# Patient Record
Sex: Female | Born: 1965
Health system: Southern US, Community
[De-identification: ages and names within clinical notes are randomized; demographics above are authoritative.]

---

## 2006-10-01 ENCOUNTER — Other Ambulatory Visit: Admission: RE | Admit: 2006-10-01 | Discharge: 2006-10-01 | Payer: Self-pay | Admitting: Gynecology

## 2006-10-30 ENCOUNTER — Ambulatory Visit (HOSPITAL_COMMUNITY): Admission: RE | Admit: 2006-10-30 | Discharge: 2006-10-30 | Payer: Self-pay | Admitting: Gynecology

## 2007-04-16 ENCOUNTER — Ambulatory Visit (HOSPITAL_COMMUNITY): Admission: RE | Admit: 2007-04-16 | Discharge: 2007-04-16 | Payer: Self-pay | Admitting: Gynecology

## 2007-10-14 ENCOUNTER — Other Ambulatory Visit: Admission: RE | Admit: 2007-10-14 | Discharge: 2007-10-14 | Payer: Self-pay | Admitting: Gynecology

## 2007-10-28 ENCOUNTER — Encounter: Admission: RE | Admit: 2007-10-28 | Discharge: 2007-10-28 | Payer: Self-pay | Admitting: Gynecology

## 2010-08-14 ENCOUNTER — Encounter: Payer: Self-pay | Admitting: Gynecology

## 2011-12-11 ENCOUNTER — Other Ambulatory Visit: Payer: Self-pay | Admitting: Gastroenterology

## 2011-12-11 DIAGNOSIS — B182 Chronic viral hepatitis C: Secondary | ICD-10-CM

## 2012-01-08 ENCOUNTER — Other Ambulatory Visit: Payer: Self-pay

## 2012-01-15 ENCOUNTER — Ambulatory Visit
Admission: RE | Admit: 2012-01-15 | Discharge: 2012-01-15 | Disposition: A | Discharge status: Home / Self Care | Payer: BC Managed Care – PPO | Source: Ambulatory Visit | Attending: Gastroenterology | Admitting: Gastroenterology

## 2012-01-15 DIAGNOSIS — B182 Chronic viral hepatitis C: Secondary | ICD-10-CM

## 2013-02-12 ENCOUNTER — Other Ambulatory Visit: Payer: Self-pay | Admitting: Gastroenterology

## 2013-02-17 ENCOUNTER — Ambulatory Visit
Admission: RE | Admit: 2013-02-17 | Discharge: 2013-02-17 | Disposition: A | Discharge status: Home / Self Care | Payer: BC Managed Care – PPO | Source: Ambulatory Visit | Attending: Gastroenterology | Admitting: Gastroenterology

## 2013-07-24 HISTORY — PX: BREAST BIOPSY: SHX20

## 2013-12-31 ENCOUNTER — Other Ambulatory Visit: Payer: Self-pay | Admitting: Gynecology

## 2013-12-31 DIAGNOSIS — R928 Other abnormal and inconclusive findings on diagnostic imaging of breast: Secondary | ICD-10-CM

## 2014-01-12 ENCOUNTER — Other Ambulatory Visit: Payer: Self-pay | Admitting: Gynecology

## 2014-01-12 ENCOUNTER — Ambulatory Visit
Admission: RE | Admit: 2014-01-12 | Discharge: 2014-01-12 | Disposition: A | Discharge status: Home / Self Care | Payer: 59 | Source: Ambulatory Visit | Attending: Gynecology | Admitting: Gynecology

## 2014-01-12 DIAGNOSIS — R928 Other abnormal and inconclusive findings on diagnostic imaging of breast: Secondary | ICD-10-CM

## 2014-01-12 DIAGNOSIS — R921 Mammographic calcification found on diagnostic imaging of breast: Secondary | ICD-10-CM

## 2014-02-16 ENCOUNTER — Ambulatory Visit
Admission: RE | Admit: 2014-02-16 | Discharge: 2014-02-16 | Disposition: A | Discharge status: Home / Self Care | Payer: 59 | Source: Ambulatory Visit | Attending: Gynecology | Admitting: Gynecology

## 2014-02-16 ENCOUNTER — Other Ambulatory Visit: Payer: Self-pay | Admitting: Gynecology

## 2014-02-16 DIAGNOSIS — R921 Mammographic calcification found on diagnostic imaging of breast: Secondary | ICD-10-CM

## 2014-02-16 DIAGNOSIS — R928 Other abnormal and inconclusive findings on diagnostic imaging of breast: Secondary | ICD-10-CM

## 2014-03-18 ENCOUNTER — Other Ambulatory Visit: Payer: Self-pay | Admitting: Gastroenterology

## 2014-03-18 DIAGNOSIS — B182 Chronic viral hepatitis C: Secondary | ICD-10-CM

## 2014-04-06 ENCOUNTER — Ambulatory Visit
Admission: RE | Admit: 2014-04-06 | Discharge: 2014-04-06 | Disposition: A | Discharge status: Home / Self Care | Payer: 59 | Source: Ambulatory Visit | Attending: Gastroenterology | Admitting: Gastroenterology

## 2014-04-06 DIAGNOSIS — B182 Chronic viral hepatitis C: Secondary | ICD-10-CM

## 2014-10-27 IMAGING — MG MM LT BREAST BX W LOC DEV 1ST LESION
3 series · 3 of 3 positions shown · non-contrast
Comparison: Previous exams.

ADDENDUM:
Pathology revealed fibrocystic changes with calcifications in the
left breast. This was found to be concordant by Dr. Spandana Bodden.
Pathology was discussed with the patient by telephone. The patient
reported doing well after the biopsy. Post biopsy instructions were
reviewed and her questions were answered. She was encouraged to call
She was asked to return in 6 months for left diagnostic mammography.

Pathology results reported by Ismaelibrahim Mardan RN, BSN on February 17, 2014.
CLINICAL DATA: Suspicious left breast upper outer quadrant 3 mm
cluster of coarse heterogeneous calcifications
EXAM:
Left BREAST STEREOTACTIC CORE NEEDLE BIOPSY

[L CC]
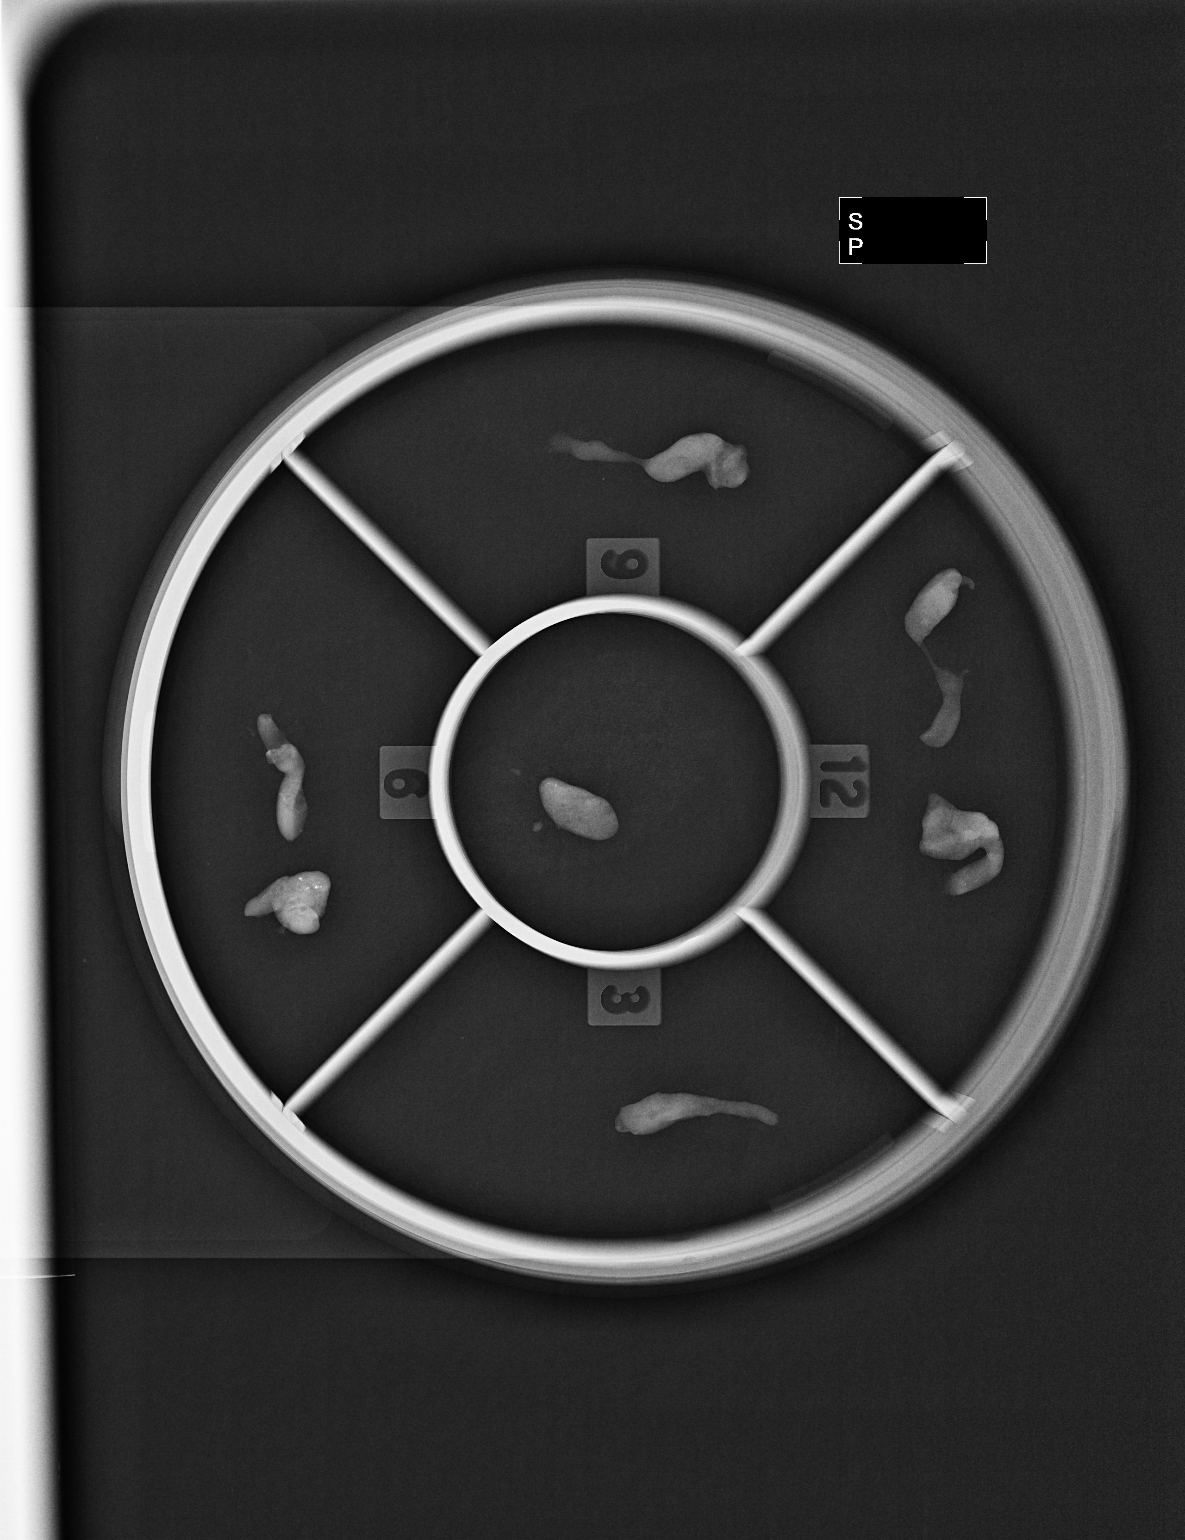

[L ML]
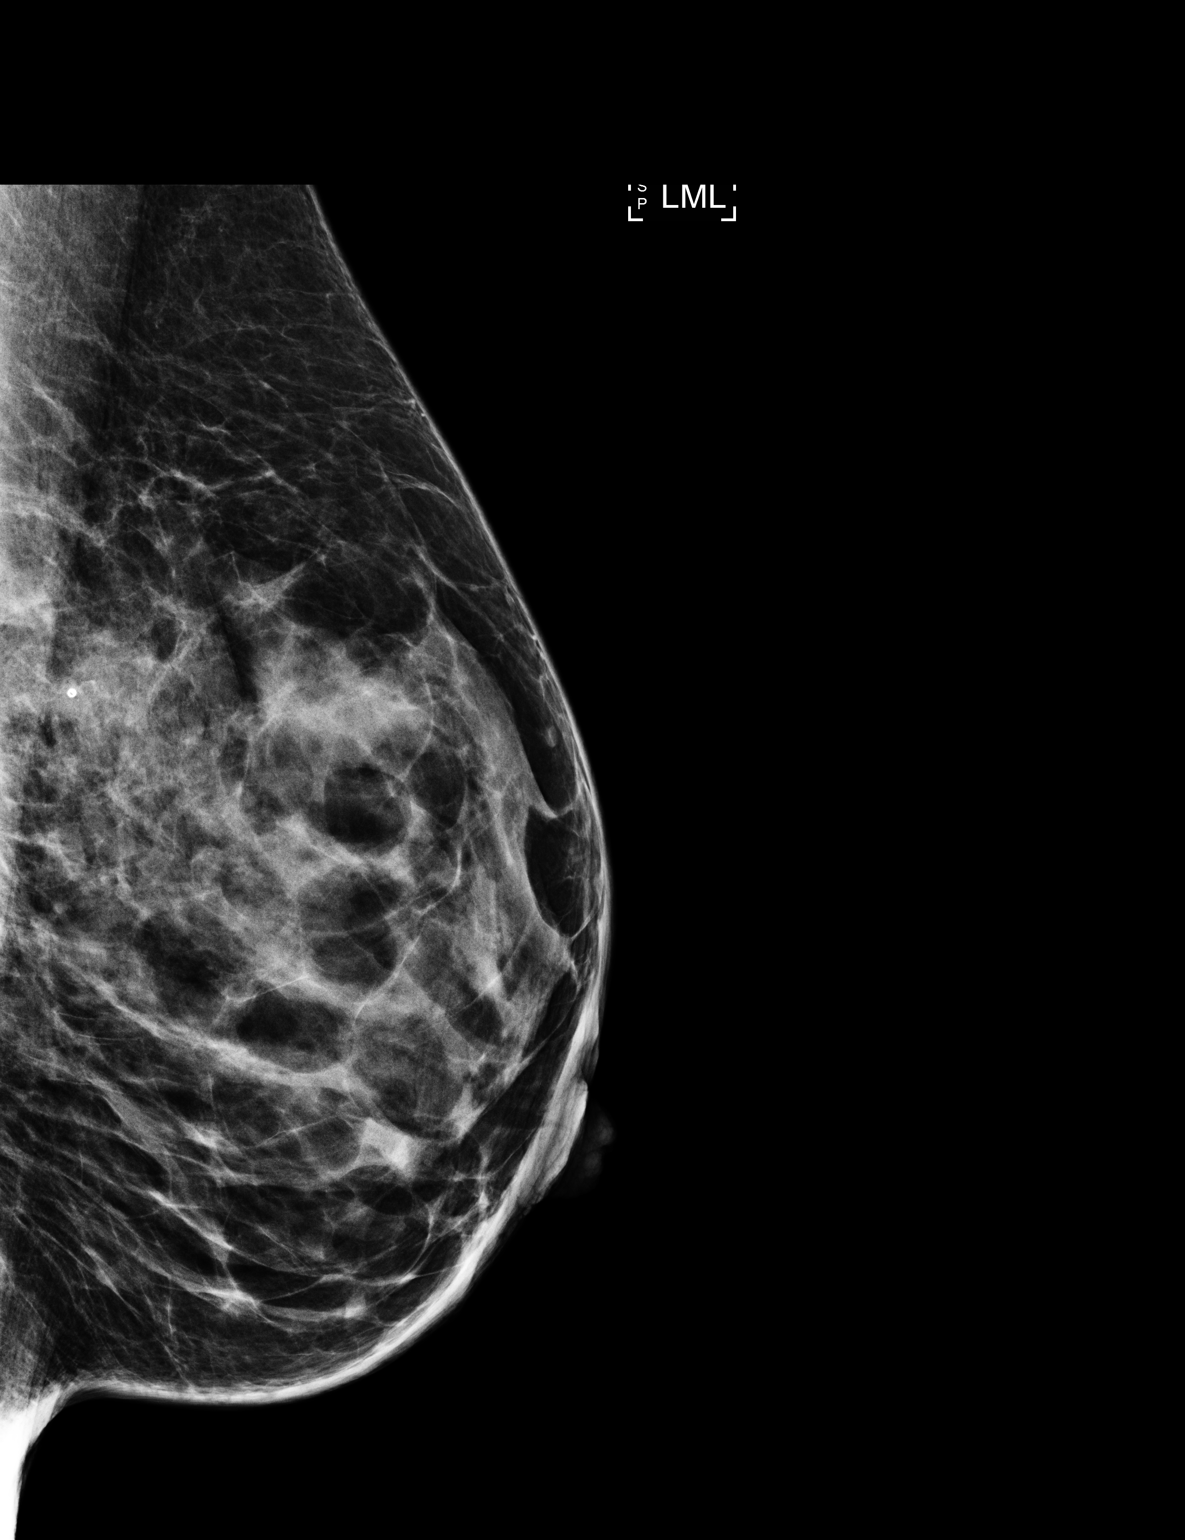

[L XCCL]
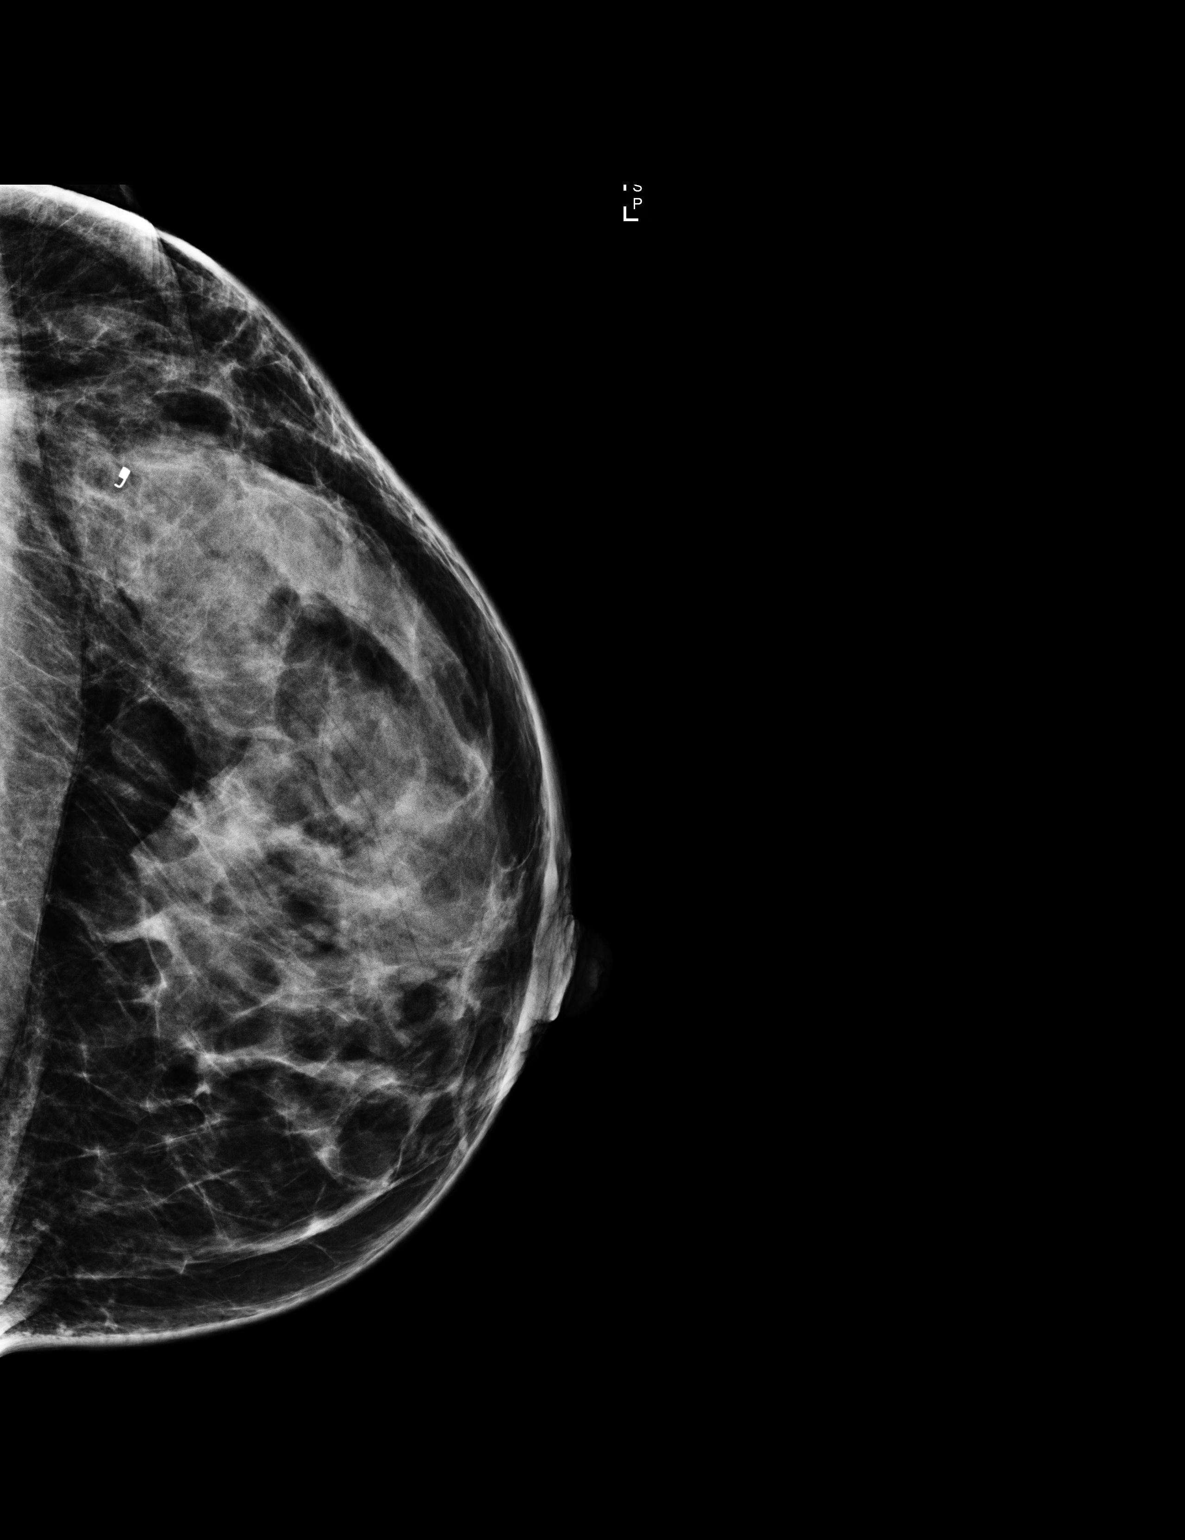

[3 of 3 positions shown; findings below may reference images not displayed]



Using sterile technique and 2% Lidocaine as local anesthetic, under
stereotactic guidance, a 12 gauge core vacuum device was used to
perform core needle biopsy of calcifications in the left upper outer
quadrant using a lateral to medial approach. Specimen radiograph was
performed showing calcifications in the biopsy samples. Specimens
with calcifications are identified for pathology.

At the conclusion of the procedure, a coil shaped tissue marker clip
was deployed into the biopsy cavity. Follow-up 2-view mammogram
confirmed clip placement at the biopsy site.
IMPRESSION: Stereotactic-guided biopsy of left upper outer quadrant
calcifications with coil shaped clip placement. Pathology is
pending. No apparent complications.

## 2015-04-19 ENCOUNTER — Other Ambulatory Visit: Payer: Self-pay | Admitting: Gynecology

## 2015-04-21 LAB — CYTOLOGY - PAP

## 2016-08-28 ENCOUNTER — Other Ambulatory Visit: Payer: Self-pay | Admitting: Obstetrics and Gynecology

## 2016-08-28 DIAGNOSIS — R921 Mammographic calcification found on diagnostic imaging of breast: Secondary | ICD-10-CM

## 2016-09-04 ENCOUNTER — Ambulatory Visit
Admission: RE | Admit: 2016-09-04 | Discharge: 2016-09-04 | Disposition: A | Discharge status: Home / Self Care | Payer: Self-pay | Source: Ambulatory Visit | Attending: Obstetrics and Gynecology | Admitting: Obstetrics and Gynecology

## 2016-09-04 DIAGNOSIS — R921 Mammographic calcification found on diagnostic imaging of breast: Secondary | ICD-10-CM

## 2019-04-16 ENCOUNTER — Other Ambulatory Visit: Payer: Self-pay | Admitting: Obstetrics and Gynecology

## 2019-04-16 DIAGNOSIS — R928 Other abnormal and inconclusive findings on diagnostic imaging of breast: Secondary | ICD-10-CM

## 2019-04-21 ENCOUNTER — Other Ambulatory Visit: Payer: Self-pay | Admitting: Obstetrics and Gynecology

## 2019-04-21 ENCOUNTER — Other Ambulatory Visit: Payer: Self-pay

## 2019-04-21 ENCOUNTER — Ambulatory Visit: Payer: Self-pay

## 2019-04-21 ENCOUNTER — Ambulatory Visit
Admission: RE | Admit: 2019-04-21 | Discharge: 2019-04-21 | Disposition: A | Discharge status: Home / Self Care | Payer: 59 | Source: Ambulatory Visit | Attending: Obstetrics and Gynecology | Admitting: Obstetrics and Gynecology

## 2019-04-21 DIAGNOSIS — R928 Other abnormal and inconclusive findings on diagnostic imaging of breast: Secondary | ICD-10-CM

## 2019-04-21 DIAGNOSIS — R921 Mammographic calcification found on diagnostic imaging of breast: Secondary | ICD-10-CM

## 2019-12-08 ENCOUNTER — Other Ambulatory Visit: Payer: Self-pay | Admitting: Obstetrics and Gynecology

## 2019-12-08 ENCOUNTER — Ambulatory Visit
Admission: RE | Admit: 2019-12-08 | Discharge: 2019-12-08 | Disposition: A | Discharge status: Home / Self Care | Payer: 59 | Source: Ambulatory Visit | Attending: Obstetrics and Gynecology | Admitting: Obstetrics and Gynecology

## 2019-12-08 ENCOUNTER — Other Ambulatory Visit: Payer: Self-pay

## 2019-12-08 DIAGNOSIS — R921 Mammographic calcification found on diagnostic imaging of breast: Secondary | ICD-10-CM

## 2020-06-14 ENCOUNTER — Other Ambulatory Visit: Payer: Self-pay

## 2020-06-14 ENCOUNTER — Ambulatory Visit
Admission: RE | Admit: 2020-06-14 | Discharge: 2020-06-14 | Disposition: A | Discharge status: Home / Self Care | Payer: 59 | Source: Ambulatory Visit | Attending: Obstetrics and Gynecology | Admitting: Obstetrics and Gynecology

## 2020-06-14 ENCOUNTER — Other Ambulatory Visit: Payer: Self-pay | Admitting: Obstetrics and Gynecology

## 2020-06-14 DIAGNOSIS — R921 Mammographic calcification found on diagnostic imaging of breast: Secondary | ICD-10-CM

## 2021-10-24 ENCOUNTER — Other Ambulatory Visit: Payer: Self-pay | Admitting: Obstetrics and Gynecology

## 2021-10-24 DIAGNOSIS — R921 Mammographic calcification found on diagnostic imaging of breast: Secondary | ICD-10-CM

## 2021-11-14 ENCOUNTER — Ambulatory Visit
Admission: RE | Admit: 2021-11-14 | Discharge: 2021-11-14 | Disposition: A | Discharge status: Home / Self Care | Payer: 59 | Source: Ambulatory Visit | Attending: Obstetrics and Gynecology | Admitting: Obstetrics and Gynecology

## 2021-11-14 DIAGNOSIS — R921 Mammographic calcification found on diagnostic imaging of breast: Secondary | ICD-10-CM
